# Patient Record
Sex: Female | Born: 1990 | Hispanic: Yes | Marital: Single | Smoking: Never smoker
Health system: Southern US, Community
[De-identification: ages and names within clinical notes are randomized; demographics above are authoritative.]

---

## 2013-12-07 ENCOUNTER — Ambulatory Visit: Payer: 59

## 2013-12-07 ENCOUNTER — Ambulatory Visit (INDEPENDENT_AMBULATORY_CARE_PROVIDER_SITE_OTHER): Payer: 59 | Admitting: Internal Medicine

## 2013-12-07 VITALS — BP 110/76 | HR 98 | Temp 98.0°F | Resp 16 | Ht 63.75 in | Wt 176.4 lb

## 2013-12-07 DIAGNOSIS — M545 Low back pain, unspecified: Secondary | ICD-10-CM

## 2013-12-07 DIAGNOSIS — R229 Localized swelling, mass and lump, unspecified: Secondary | ICD-10-CM

## 2013-12-07 DIAGNOSIS — R222 Localized swelling, mass and lump, trunk: Secondary | ICD-10-CM

## 2013-12-07 DIAGNOSIS — IMO0002 Reserved for concepts with insufficient information to code with codable children: Secondary | ICD-10-CM

## 2013-12-07 LAB — POCT CBC
GRANULOCYTE PERCENT: 52.4 % (ref 37–80)
HCT, POC: 40.5 % (ref 37.7–47.9)
Hemoglobin: 13.3 g/dL (ref 12.2–16.2)
Lymph, poc: 2.4 (ref 0.6–3.4)
MCH, POC: 30.6 pg (ref 27–31.2)
MCHC: 32.8 g/dL (ref 31.8–35.4)
MCV: 93.1 fL (ref 80–97)
MID (cbc): 0.3 (ref 0–0.9)
MPV: 9.4 fL (ref 0–99.8)
POC Granulocyte: 3 (ref 2–6.9)
POC LYMPH PERCENT: 41.8 %L (ref 10–50)
POC MID %: 5.8 %M (ref 0–12)
Platelet Count, POC: 302 10*3/uL (ref 142–424)
RBC: 4.35 M/uL (ref 4.04–5.48)
RDW, POC: 12.5 %
WBC: 5.8 10*3/uL (ref 4.6–10.2)

## 2013-12-07 LAB — POCT SEDIMENTATION RATE: POCT SED RATE: 16 mm/hr (ref 0–22)

## 2013-12-07 MED ORDER — IBUPROFEN 600 MG PO TABS: 600.0000 mg | ORAL_TABLET | Freq: Three times a day (TID) | ORAL | Status: AC | PRN

## 2013-12-07 MED ORDER — METHOCARBAMOL 750 MG PO TABS: 750.0000 mg | ORAL_TABLET | Freq: Four times a day (QID) | ORAL | Status: AC

## 2013-12-07 NOTE — Patient Instructions (Addendum)
Abscess An abscess is an infected area that contains a collection of pus and debris.It can occur in almost any part of the body. An abscess is also known as a furuncle or boil. CAUSES  An abscess occurs when tissue gets infected. This can occur from blockage of oil or sweat glands, infection of hair follicles, or a minor injury to the skin. As the body tries to fight the infection, pus collects in the area and creates pressure under the skin. This pressure causes pain. People with weakened immune systems have difficulty fighting infections and get certain abscesses more often.  SYMPTOMS Usually an abscess develops on the skin and becomes a painful mass that is red, warm, and tender. If the abscess forms under the skin, you may feel a moveable soft area under the skin. Some abscesses break open (rupture) on their own, but most will continue to get worse without care. The infection can spread deeper into the body and eventually into the bloodstream, causing you to feel ill.  DIAGNOSIS  Your caregiver will take your medical history and perform a physical exam. A sample of fluid may also be taken from the abscess to determine what is causing your infection. TREATMENT  Your caregiver may prescribe antibiotic medicines to fight the infection. However, taking antibiotics alone usually does not cure an abscess. Your caregiver may need to make a small cut (incision) in the abscess to drain the pus. In some cases, gauze is packed into the abscess to reduce pain and to continue draining the area. HOME CARE INSTRUCTIONS   Only take over-the-counter or prescription medicines for pain, discomfort, or fever as directed by your caregiver.  If you were prescribed antibiotics, take them as directed. Finish them even if you start to feel better.  If gauze is used, follow your caregiver's directions for changing the gauze.  To avoid spreading the infection:  Keep your draining abscess covered with a  bandage.  Wash your hands well.  Do not share personal care items, towels, or whirlpools with others.  Avoid skin contact with others.  Keep your skin and clothes clean around the abscess.  Keep all follow-up appointments as directed by your caregiver. SEEK MEDICAL CARE IF:   You have increased pain, swelling, redness, fluid drainage, or bleeding.  You have muscle aches, chills, or a general ill feeling.  You have a fever. MAKE SURE YOU:   Understand these instructions.  Will watch your condition.  Will get help right away if you are not doing well or get worse. Document Released: 04/18/2005 Document Revised: 01/08/2012 Document Reviewed: 09/21/2011 [facility] Patient Information 2014 [place], [place]. Lipoma A lipoma is a noncancerous (benign) tumor composed of fat cells. They are usually found under the skin (subcutaneous). A lipoma may occur in any tissue of the body that contains fat. Common areas for lipomas to appear include the back, shoulders, buttocks, and thighs. Lipomas are a very common soft tissue growth. They are soft and grow slowly. Most problems caused by a lipoma depend on where it is growing. DIAGNOSIS  A lipoma can be diagnosed with a physical exam. These tumors rarely become cancerous, but radiographic studies can help determine this for certain. Studies used may include:  Computerized X-ray scans (CT or CAT scan).  Computerized magnetic scans (MRI). TREATMENT  Small lipomas that are not causing problems may be watched. If a lipoma continues to enlarge or causes problems, removal is often the best treatment. Lipomas can also be removed to improve appearance.  Surgery is done to remove the fatty cells and the surrounding capsule. Most often, this is done with medicine that numbs the area (local anesthetic). The removed tissue is examined under a microscope to make sure it is not cancerous. Keep all follow-up appointments with your caregiver. SEEK MEDICAL CARE IF:    The lipoma becomes larger or hard.  The lipoma becomes painful, red, or increasingly swollen. These could be signs of infection or a more serious condition. Document Released: 06/29/2002 Document Revised: 10/01/2011 Document Reviewed: 12/09/2009 [facility] Patient Information 2014 [place], [place].

## 2013-12-07 NOTE — Progress Notes (Signed)
   Subjective:    Patient ID: Tina MusicKristina Haynes, female    DOB: 12/17/1990, 23 y.o.   MRN: [id number]  HPI 23 yr old female is today with complaints of mass above left buttocks that has been increasing in pain for the week and half. She states she first notice the mass approximately 2 years ago.She states that for two weeks she has noticed pain in that area. She states that she feels like the mass has grown and her boyfriend noticed a bruise over the mass this past weekend.She states has used Flexeril, ice, heat, massages, ibuprofen and Tylenol with no relief.  She states she has no other complaints. No radiation weakness, numbness, or incontinence.   Review of Systems healthy    Objective:   Physical Exam   [facility] reading (PRIMARY) by  Dr.Guest. Normal back and pelvis xrs Results for orders placed in visit on 12/07/13  POCT CBC      Result Value Ref Range   WBC 5.8  4.6 - 10.2 K/uL   Lymph, poc 2.4  0.6 - 3.4   POC LYMPH PERCENT 41.8  10 - 50 %L   MID (cbc) 0.3  0 - 0.9   POC MID % 5.8  0 - 12 %M   POC Granulocyte 3.0  2 - 6.9   Granulocyte percent 52.4  37 - 80 %G   RBC 4.35  4.04 - 5.48 M/uL   Hemoglobin 13.3  12.2 - 16.2 g/dL   HCT, POC 40.940.5  81.137.7 - 47.9 %   MCV 93.1  80 - 97 fL   MCH, POC 30.6  27 - 31.2 pg   MCHC 32.8  31.8 - 35.4 g/dL   RDW, POC 91.412.5     Platelet Count, POC 302  142 - 424 K/uL   MPV 9.4  0 - 99.8 fL   Sed rate pending      Assessment & Plan:  LB pain 2 weeks Mass left sacrum growing over months to years/Consider referral or ct scan Refer to dermatology Back strain care/Motrin/Robaxin

## 2015-01-05 IMAGING — CR DG LUMBAR SPINE COMPLETE 4+V
5 series · 5 of 5 positions shown · non-contrast
Comparison: None.

CLINICAL DATA: Low back pain

EXAM:
LUMBAR SPINE - COMPLETE 4+ VIEW

[AP]
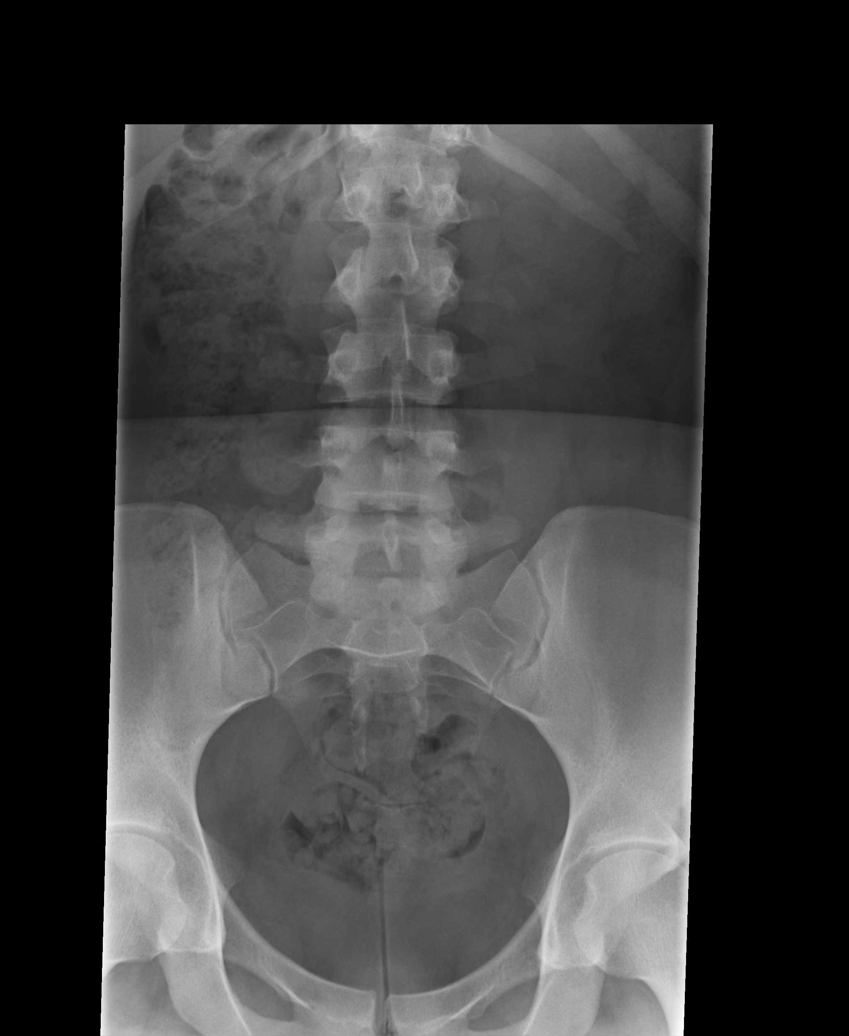

[rpo]
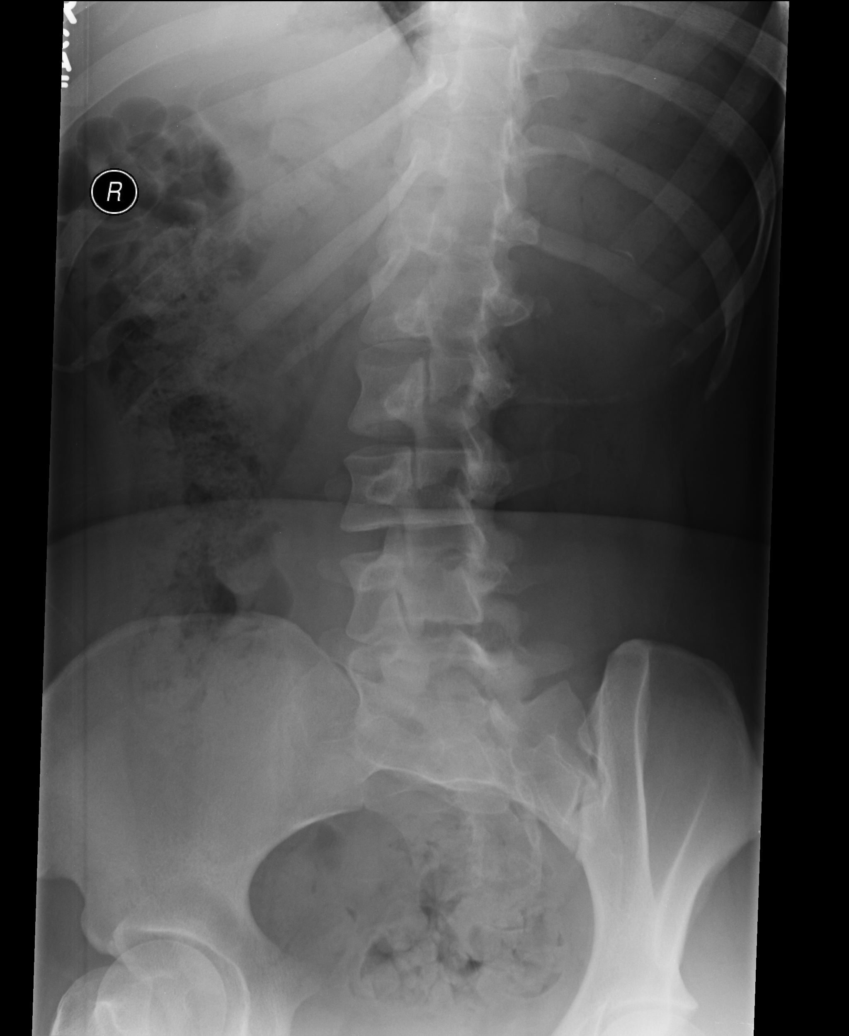

[lpo]
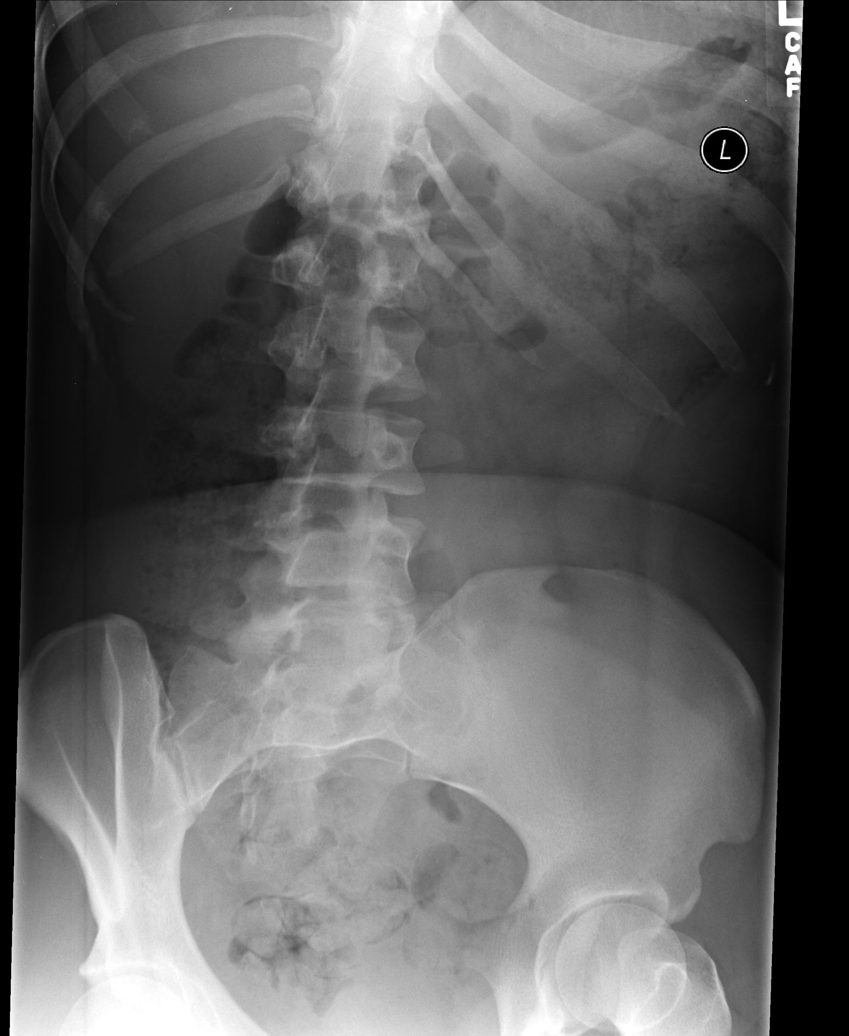

[lateral]
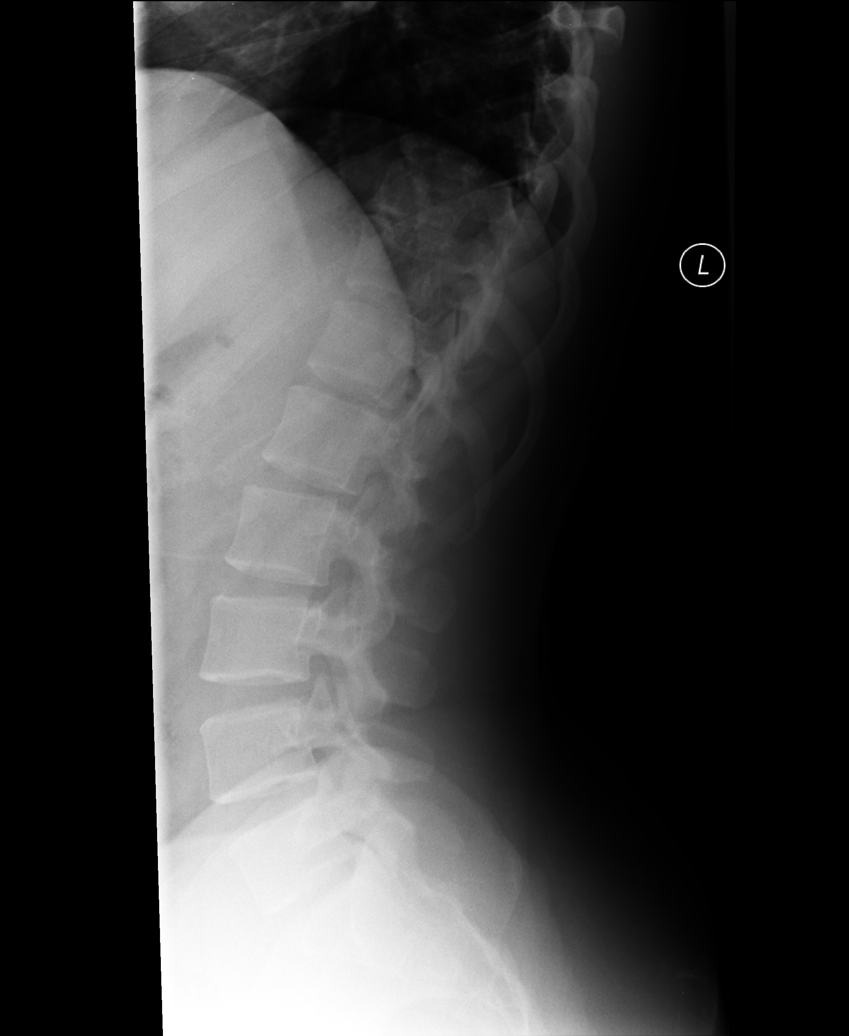

[l5 s1]
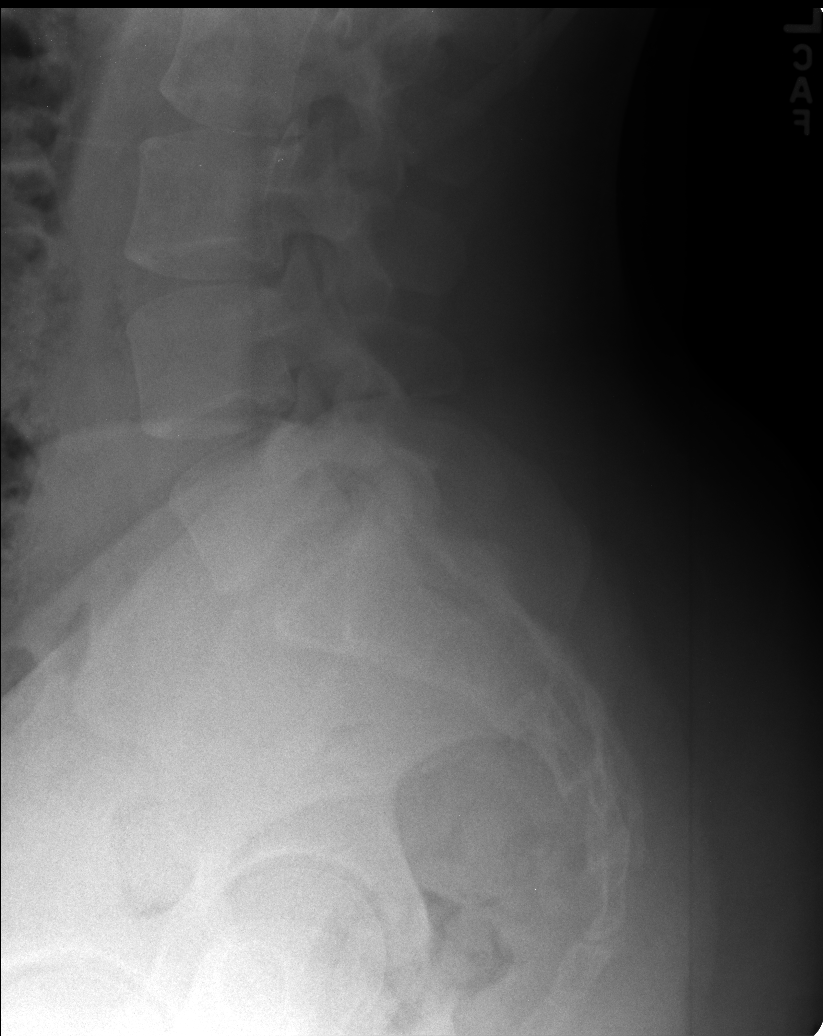

[5 of 5 positions shown; findings below may reference images not displayed]

FINDINGS: Frontal, lateral, spot lumbosacral lateral, and bilateral oblique
views were obtained. There is no fracture or spondylolisthesis. Disc
spaces appear intact. There is no appreciable facet arthropathy.
IMPRESSION: No fracture or appreciable arthropathy.

## 2015-01-05 IMAGING — CR DG PELVIS 1-2V
1 series · 1 of 1 positions shown · non-contrast
Comparison: None.

CLINICAL DATA: Low back pain

EXAM:
PELVIS - 1-2 VIEW

[AP]
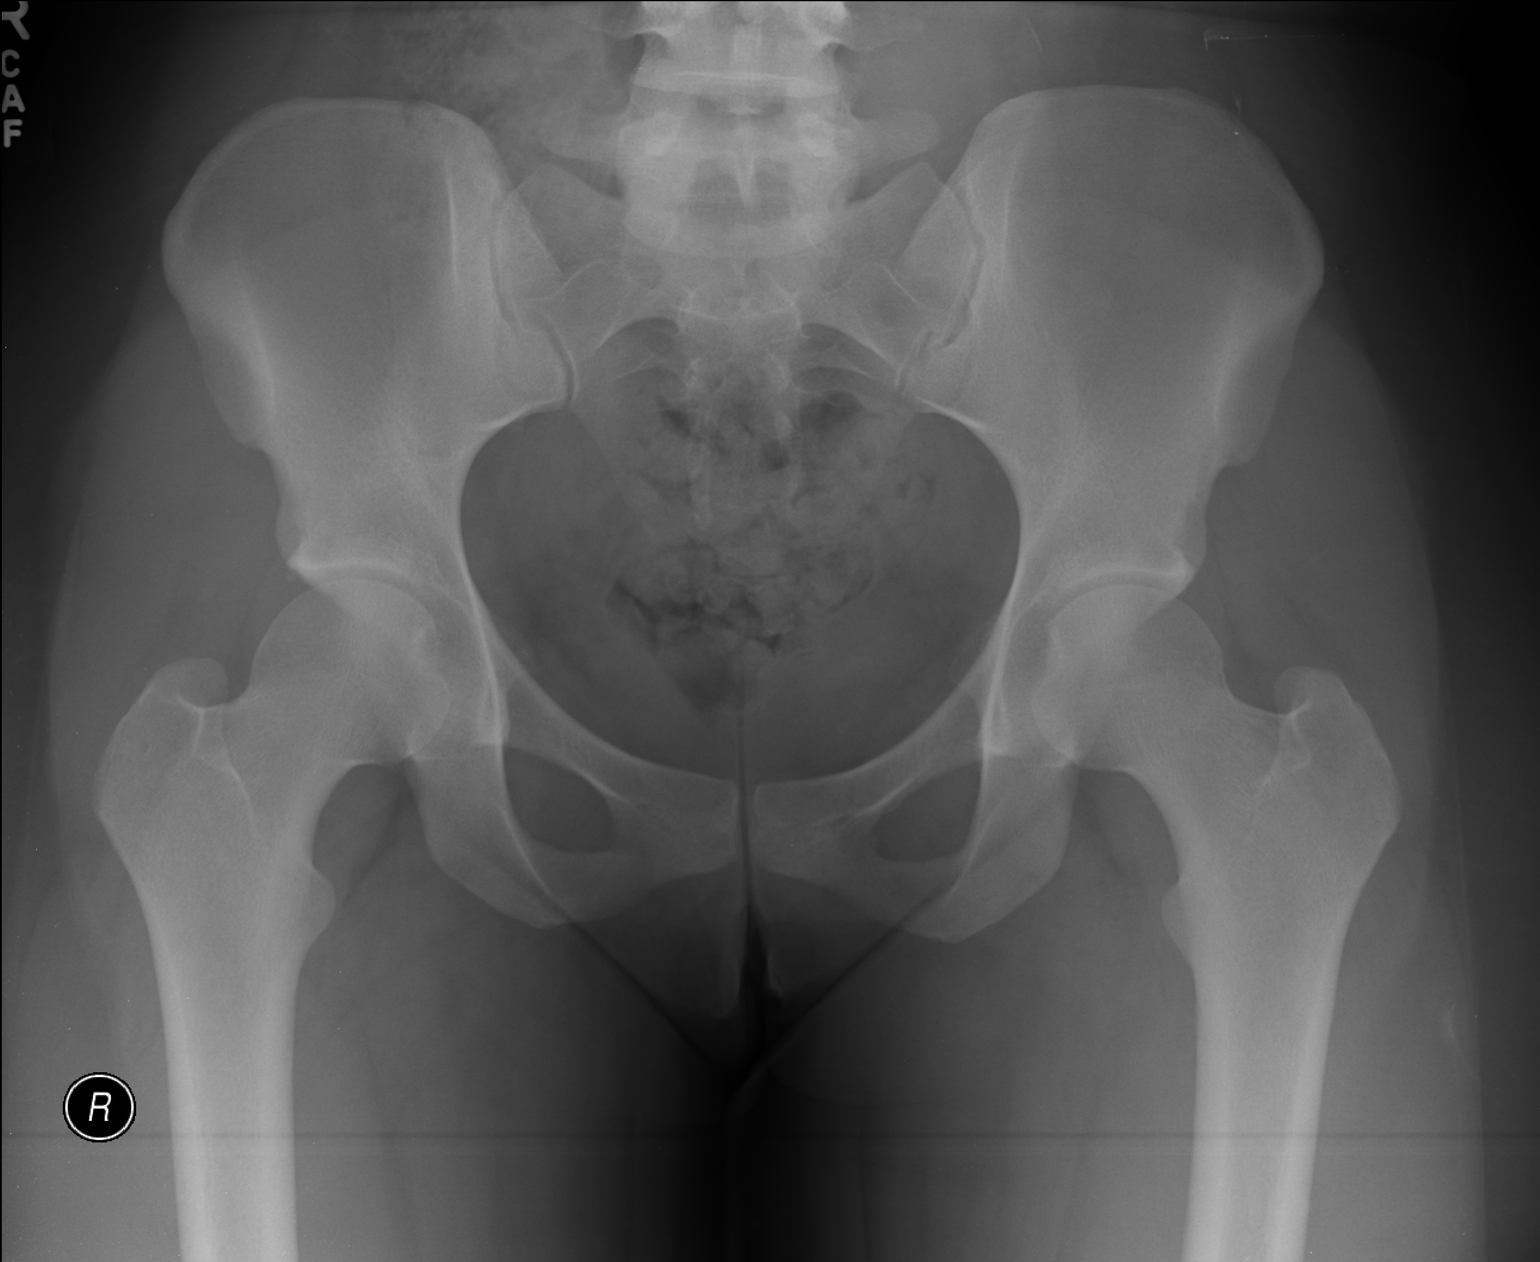

[1 of 1 positions shown; findings below may reference images not displayed]

FINDINGS: There is no evidence of pelvic fracture or dislocation. Joint spaces
appear intact. No erosive change.
IMPRESSION: No abnormality noted radiographically.
